# Patient Record
Sex: Female | Born: 1975 | Race: White | Hispanic: No | Marital: Married
Health system: Midwestern US, Community
[De-identification: ages and names within clinical notes are randomized; demographics above are authoritative.]

## PROBLEM LIST (undated history)

## (undated) DIAGNOSIS — F319 Bipolar disorder, unspecified: Secondary | ICD-10-CM

## (undated) DIAGNOSIS — F419 Anxiety disorder, unspecified: Secondary | ICD-10-CM

## (undated) DIAGNOSIS — G43909 Migraine, unspecified, not intractable, without status migrainosus: Secondary | ICD-10-CM

## (undated) DIAGNOSIS — F410 Panic disorder [episodic paroxysmal anxiety] without agoraphobia: Secondary | ICD-10-CM

## (undated) HISTORY — PX: ABDOMINAL HYSTERECTOMY: SHX81

## (undated) HISTORY — PX: CHOLECYSTECTOMY: SHX55

## (undated) HISTORY — PX: APPENDECTOMY: SHX54

---

## 2005-06-27 ENCOUNTER — Emergency Department (HOSPITAL_COMMUNITY): Admission: EM | Admit: 2005-06-27 | Discharge: 2005-06-28 | Payer: Self-pay | Admitting: Emergency Medicine

## 2005-07-24 ENCOUNTER — Emergency Department: Payer: Self-pay | Admitting: Emergency Medicine

## 2007-07-27 ENCOUNTER — Emergency Department: Payer: Self-pay | Admitting: Emergency Medicine

## 2007-12-27 ENCOUNTER — Emergency Department: Payer: Self-pay | Admitting: Emergency Medicine

## 2008-10-11 ENCOUNTER — Emergency Department: Payer: Self-pay | Admitting: Emergency Medicine

## 2009-03-10 ENCOUNTER — Emergency Department: Payer: Self-pay | Admitting: Emergency Medicine

## 2009-06-23 ENCOUNTER — Emergency Department: Payer: Self-pay | Admitting: Emergency Medicine

## 2009-08-09 ENCOUNTER — Emergency Department: Payer: Self-pay | Admitting: Emergency Medicine

## 2010-04-05 ENCOUNTER — Emergency Department: Payer: Self-pay | Admitting: Emergency Medicine

## 2010-05-23 ENCOUNTER — Ambulatory Visit: Payer: Self-pay | Admitting: Internal Medicine

## 2010-12-14 ENCOUNTER — Ambulatory Visit: Payer: Self-pay | Admitting: Family Medicine

## 2011-07-17 ENCOUNTER — Emergency Department: Payer: Self-pay | Admitting: *Deleted

## 2012-06-22 ENCOUNTER — Ambulatory Visit: Payer: Self-pay

## 2015-04-20 ENCOUNTER — Ambulatory Visit
Admission: EM | Admit: 2015-04-20 | Discharge: 2015-04-20 | Disposition: A | Discharge status: Home / Self Care | Payer: Self-pay | Attending: Family Medicine | Admitting: Family Medicine

## 2015-04-20 ENCOUNTER — Encounter: Payer: Self-pay | Admitting: Emergency Medicine

## 2015-04-20 DIAGNOSIS — F411 Generalized anxiety disorder: Secondary | ICD-10-CM

## 2015-04-20 DIAGNOSIS — F430 Acute stress reaction: Secondary | ICD-10-CM

## 2015-04-20 HISTORY — DX: Anxiety disorder, unspecified: F41.9

## 2015-04-20 HISTORY — DX: Migraine, unspecified, not intractable, without status migrainosus: G43.909

## 2015-04-20 MED ORDER — DIAZEPAM 5 MG PO TABS: 5.0000 mg | ORAL_TABLET | Freq: Two times a day (BID) | ORAL | Status: AC

## 2015-04-20 MED ORDER — ESCITALOPRAM OXALATE 10 MG PO TABS: 10.0000 mg | ORAL_TABLET | Freq: Every day | ORAL | Status: AC

## 2015-04-20 NOTE — ED Notes (Signed)
Anxiety regarding work condition for 6 weeks. Recently got worse.

## 2015-04-20 NOTE — ED Provider Notes (Signed)
CSN: [id number]     Arrival date & time 04/20/15  1651 History   First MD Initiated Contact with Patient 04/20/15 1803     Chief Complaint  Patient presents with  . Anxiety   Patient is here because of anxiety attack.  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 She states that the DON  Is getting ready to leave. She reports being at the facility for over 6 years. She is unhappy wants to leave but feels trapped because she cannot Match her salary at her current rate. Patient's tearful at this time. She is able to give me a history that last time she had panic attacks like this was about 4-5 years ago. She is allergic to several SSRIs and benzos  but has been able to tolerate Celexa and diazepam she was also taking years ago Inderal which also seemed to help with things as well.    She reports not want to take medication despite the fact that she has no clear way out of the situation that she feels as initiated her panic attack and anxiety.   should also be noted this is consistent with she's feeling now with her previous panic attack and anxiety attacks.   (Consider location/radiation/quality/duration/timing/severity/associated sxs/prior Treatment) Patient is a 39 y.o. female presenting with anxiety. The history is provided by the patient. No language interpreter was used.  Anxiety This is a new problem. The current episode started 6 to 12 hours ago. The problem occurs constantly. The problem has not changed since onset.Pertinent negatives include no chest pain, no abdominal pain, no headaches and no shortness of breath. The symptoms are aggravated by stress. Nothing relieves the symptoms. She has tried nothing for the symptoms.    Past Medical History  Diagnosis Date  . Anxiety   . Migraine    Past Surgical History  Procedure Laterality Date  . Abdominal hysterectomy    . Cholecystectomy    . Appendectomy      Family History  Problem Relation Age of Onset  . Hyperlipidemia Mother   . Hypertension Mother   . Diabetes Mother   . Diabetes Father   . Hyperlipidemia Father   . Hypertension Father    Social History  Substance Use Topics  . Smoking status: Current Every Day Smoker  . Smokeless tobacco: Current User  . Alcohol Use: No   OB History    No data available     Review of Systems  Respiratory: Negative for shortness of breath.   Cardiovascular: Negative for chest pain.  Gastrointestinal: Negative for abdominal pain.  Neurological: Negative for headaches.   Patient does smoke and was once she needs to stop smoking. She denies any problems with hypertension diabetes. Allergies  Chocolate; Citrus; Clonazepam; Grastek; Imitrex; and Prozac  Home Medications   Prior to Admission medications   Medication Sig Start Date End Date Taking? Authorizing Provider  acetaminophen (TYLENOL) 500 MG tablet Take 500 mg by mouth every 6 (six) hours as needed.   Yes Historical Provider, MD  aspirin-acetaminophen-caffeine (EXCEDRIN MIGRAINE) [phone] MG per tablet Take by mouth every 6 (six) hours as needed for headache.   Yes Historical Provider, MD  diazepam (VALIUM) 5 MG tablet Take 1 tablet (5 mg total) by mouth 2 (two) times daily. 04/20/15   Hassan Rowan, MD  escitalopram (LEXAPRO) 10 MG tablet Take 1 tablet (10 mg total) by mouth daily. 04/20/15   Dennard Nip  Thurmond Butts, MD   BP 133/68 mmHg  Pulse 98  Temp(Src) 97.8 F (36.6 C) (Tympanic)  Resp 16  Ht  (1.626 m)  Wt 222 lb (100.699 kg)  BMI 38.09 kg/m2  SpO2 99% Physical Exam  Constitutional: She is oriented to person, place, and time. She appears well-developed and well-nourished.  Obese white female.  HENT:  Head: Normocephalic and atraumatic.  Eyes: Pupils are equal, round, and reactive to light.  Neck: Normal range of motion. Neck supple.  Cardiovascular: Normal rate, regular rhythm and normal heart sounds.   Pulmonary/Chest:  Effort normal.  Musculoskeletal: Normal range of motion.  Neurological: She is alert and oriented to person, place, and time. No cranial nerve deficit.  Skin: Skin is warm and dry. No erythema.  Psychiatric: Her behavior is normal. Her mood appears anxious. Her speech is not rapid and/or pressured. Thought content is not delusional. Cognition and memory are normal. Cognition and memory are not impaired. She does not exhibit a depressed mood.  Patient actively crying and upset.  Vitals reviewed.   ED Course  Procedures (including critical care time) Labs Review Labs Reviewed - No data to display  Imaging Review No results found.   MDM   1. Generalized anxiety disorder   2. Situational, disturbance, acute      Discussed patient since this may be a long-term situation she needs to get a PCP where she can have her anxiety managed and she is in agreement with that. Will prescribe The SSRI Lexapro since his has less side effects than the Celexa and she's had trouble with certain SSRIs. We'll also prescribe a limited amount of diazepam as well. We'll hold off on Inderal An informed her that she may need thyroid panel testing when she does establish with a PCP. She thinks she can establish with her husband practice at [facility] primary care. Off work note for the next 2 days she declines.      Hassan Rowan, MD 04/20/15 [id number]

## 2015-04-20 NOTE — Discharge Instructions (Signed)
Generalized Anxiety Disorder Generalized anxiety disorder (GAD) is a mental disorder. It interferes with life functions, including relationships, work, and school. GAD is different from normal anxiety, which everyone experiences at some point in their lives in response to specific life events and activities. Normal anxiety actually helps [place] prepare for and get through these life events and activities. Normal anxiety goes away after the event or activity is over.  GAD causes anxiety that is not necessarily related to specific events or activities. It also causes excess anxiety in proportion to specific events or activities. The anxiety associated with GAD is also difficult to control. GAD can vary from mild to severe. People with severe GAD can have intense waves of anxiety with physical symptoms (panic attacks).  SYMPTOMS The anxiety and worry associated with GAD are difficult to control. This anxiety and worry are related to many life events and activities and also occur more days than not for 6 months or longer. People with GAD also have three or more of the following symptoms (one or more in children):  Restlessness.   Fatigue.  Difficulty concentrating.   Irritability.  Muscle tension.  Difficulty sleeping or unsatisfying sleep. DIAGNOSIS GAD is diagnosed through an assessment by your health care provider. Your health care provider will ask you questions aboutyour mood,physical symptoms, and events in your life. Your health care provider may ask you about your medical history and use of alcohol or drugs, including prescription medicines. Your health care provider may also do a physical exam and blood tests. Certain medical conditions and the use of certain substances can cause symptoms similar to those associated with GAD. Your health care provider may refer you to a mental health specialist for further evaluation. TREATMENT The following therapies are usually used to treat GAD:    Medication. Antidepressant medication usually is prescribed for long-term daily control. Antianxiety medicines may be added in severe cases, especially when panic attacks occur.   Talk therapy (psychotherapy). Certain types of talk therapy can be helpful in treating GAD by providing support, education, and guidance. A form of talk therapy called cognitive behavioral therapy can teach you healthy ways to think about and react to daily life events and activities.  Stress managementtechniques. These include yoga, meditation, and exercise and can be very helpful when they are practiced regularly. A mental health specialist can help determine which treatment is best for you. Some people see improvement with one therapy. However, other people require a combination of therapies. Document Released: 12/09/2012 Document Revised: 12/29/2013 Document Reviewed: 12/09/2012 [facility] Patient Information 2015 [place], [place]. This information is not intended to replace advice given to you by your health care provider. Make sure you discuss any questions you have with your health care provider.  Stress and Stress Management Stress is a normal reaction to life events. It is what you feel when life demands more than you are used to or more than you can handle. Some stress can be useful. For example, the stress reaction can help you catch the last bus of the day, study for a test, or meet a deadline at work. But stress that occurs too often or for too long can cause problems. It can affect your emotional health and interfere with relationships and normal daily activities. Too much stress can weaken your immune system and increase your risk for physical illness. If you already have a medical problem, stress can make it worse. CAUSES  All sorts of life events may cause stress. An event that  causes stress for one person may not be stressful for another person. Major life events commonly cause stress. These may be positive  or negative. Examples include losing your job, moving into a new home, getting married, having a baby, or losing a loved one. Less obvious life events may also cause stress, especially if they occur day after day or in combination. Examples include working long hours, driving in traffic, caring for children, being in debt, or being in a difficult relationship. SIGNS AND SYMPTOMS Stress may cause emotional symptoms including, the following:  Anxiety. This is feeling worried, afraid, on edge, overwhelmed, or out of control.  Anger. This is feeling irritated or impatient.  Depression. This is feeling sad, down, helpless, or guilty.  Difficulty focusing, remembering, or making decisions. Stress may cause physical symptoms, including the following:   Aches and pains. These may affect your head, neck, back, stomach, or other areas of your body.  Tight muscles or clenched jaw.  Low energy or trouble sleeping. Stress may cause unhealthy behaviors, including the following:   Eating to feel better (overeating) or skipping meals.  Sleeping too little, too much, or both.  Working too much or putting off tasks (procrastination).  Smoking, drinking alcohol, or using drugs to feel better. DIAGNOSIS  Stress is diagnosed through an assessment by your health care provider. Your health care provider will ask questions about your symptoms and any stressful life events.Your health care provider will also ask about your medical history and may order blood tests or other tests. Certain medical conditions and medicine can cause physical symptoms similar to stress. Mental illness can cause emotional symptoms and unhealthy behaviors similar to stress. Your health care provider may refer you to a mental health professional for further evaluation.  TREATMENT  Stress management is the recommended treatment for stress.The goals of stress management are reducing stressful life events and coping with stress in  healthy ways.  Techniques for reducing stressful life events include the following:  Stress identification. Self-monitor for stress and identify what causes stress for you. These skills may help you to avoid some stressful events.  Time management. Set your priorities, keep a calendar of events, and learn to say "no." These tools can help you avoid making too many commitments. Techniques for coping with stress include the following:  Rethinking the problem. Try to think realistically about stressful events rather than ignoring them or overreacting. Try to find the positives in a stressful situation rather than focusing on the negatives.  Exercise. Physical exercise can release both physical and emotional tension. The key is to find a form of exercise you enjoy and do it regularly.  Relaxation techniques. These relax the body and mind. Examples include yoga, meditation, tai chi, biofeedback, deep breathing, progressive muscle relaxation, listening to music, being out in nature, journaling, and other hobbies. Again, the key is to find one or more that you enjoy and can do regularly.  Healthy lifestyle. Eat a balanced diet, get plenty of sleep, and do not smoke. Avoid using alcohol or drugs to relax.  Strong support network. Spend time with family, friends, or other people you enjoy being around.Express your feelings and talk things over with someone you trust. Counseling or talktherapy with a mental health professional may be helpful if you are having difficulty managing stress on your own. Medicine is typically not recommended for the treatment of stress.Talk to your health care provider if you think you need medicine for symptoms of stress. HOME CARE INSTRUCTIONS  Keep all follow-up visits as directed by your health care provider.  Take all medicines as directed by your health care provider. SEEK MEDICAL CARE IF:  Your symptoms get worse or you start having new symptoms.  You feel  overwhelmed by your problems and can no longer manage them on your own. SEEK IMMEDIATE MEDICAL CARE IF:  You feel like hurting yourself or someone else. Document Released: 02/07/2001 Document Revised: 12/29/2013 Document Reviewed: 04/08/2013 [facility] Patient Information 2015 [place], [place]. This information is not intended to replace advice given to you by your health care provider. Make sure you discuss any questions you have with your health care provider.

## 2015-06-23 ENCOUNTER — Ambulatory Visit: Payer: Self-pay

## 2015-06-23 ENCOUNTER — Ambulatory Visit
Admission: EM | Admit: 2015-06-23 | Discharge: 2015-06-23 | Disposition: A | Discharge status: Home / Self Care | Payer: Self-pay | Attending: Family Medicine | Admitting: Family Medicine

## 2015-06-23 DIAGNOSIS — J209 Acute bronchitis, unspecified: Secondary | ICD-10-CM

## 2015-06-23 DIAGNOSIS — J40 Bronchitis, not specified as acute or chronic: Secondary | ICD-10-CM

## 2015-06-23 MED ORDER — HYDROCOD POLST-CPM POLST ER 10-8 MG/5ML PO SUER: 5.0000 mL | Freq: Every evening | ORAL | Status: AC | PRN

## 2015-06-23 MED ORDER — AZITHROMYCIN 250 MG PO TABS: ORAL_TABLET | ORAL | Status: AC

## 2015-06-23 MED ORDER — IPRATROPIUM-ALBUTEROL 0.5-2.5 (3) MG/3ML IN SOLN
3.0000 mL | Freq: Once | RESPIRATORY_TRACT | Status: AC
  Administered 2015-06-23: 3 mL via RESPIRATORY_TRACT

## 2015-06-23 MED ORDER — ALBUTEROL SULFATE HFA 108 (90 BASE) MCG/ACT IN AERS: 1.0000 | INHALATION_SPRAY | Freq: Four times a day (QID) | RESPIRATORY_TRACT | Status: AC | PRN

## 2015-06-23 MED ORDER — PREDNISONE 20 MG PO TABS: ORAL_TABLET | ORAL | Status: AC

## 2015-06-23 NOTE — ED Notes (Signed)
Started Sunday "not feeling good". Monday "feeling worse". + productive green productive cough and laryngitis. 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 + fever of 101 Monday.

## 2015-06-23 NOTE — ED Provider Notes (Signed)
CSN: [id number]     Arrival date & time 06/23/15  1544 History   First MD Initiated Contact with Patient 06/23/15 1648     Chief Complaint  Patient presents with  . URI   (Consider location/radiation/quality/duration/timing/severity/associated sxs/prior Treatment) HPI Comments: 39 yo female with a 4 days h/o progressively worsening productive cough and wheezing, fevers, chills, and intermittent shortness of breath. Denies any chest pains. Patient is a smoker (> 10 years).  The history is provided by the patient.    Past Medical History  Diagnosis Date  . Anxiety   . Migraine    Past Surgical History  Procedure Laterality Date  . Abdominal hysterectomy    . Cholecystectomy    . Appendectomy     Family History  Problem Relation Age of Onset  . Hyperlipidemia Mother   . Hypertension Mother   . Diabetes Mother   . Diabetes Father   . Hyperlipidemia Father   . Hypertension Father    Social History  Substance Use Topics  . Smoking status: Current Every Day Smoker  . Smokeless tobacco: Current User  . Alcohol Use: Yes     Comment: rarely   OB History    No data available     Review of Systems  Allergies  Chocolate; Citrus; Clonazepam; Grastek; Imitrex; and Prozac  Home Medications   Prior to Admission medications   Medication Sig Start Date End Date Taking? Authorizing Provider  dextromethorphan-guaiFENesin (MUCINEX DM) 30-600 MG 12hr tablet Take 1 tablet by mouth 2 (two) times daily.   Yes Historical Provider, MD  diazepam (VALIUM) 5 MG tablet Take 1 tablet (5 mg total) by mouth 2 (two) times daily. 04/20/15  Yes Hassan RowanEugene Wade, MD  escitalopram (LEXAPRO) 10 MG tablet Take 1 tablet (10 mg total) by mouth daily. 04/20/15  Yes Hassan RowanEugene Wade, MD  acetaminophen (TYLENOL) 500 MG tablet Take 500 mg by mouth every 6 (six) hours as needed.    Historical Provider, MD  albuterol (PROVENTIL HFA;VENTOLIN HFA) 108 (90 BASE) MCG/ACT inhaler Inhale 1-2 puffs into the lungs every 6 (six)  hours as needed for wheezing or shortness of breath. 06/23/15   Payton Mccallumrlando Bakary Bramer, MD  aspirin-acetaminophen-caffeine (EXCEDRIN MIGRAINE) [phone] MG per tablet Take by mouth every 6 (six) hours as needed for headache.    Historical Provider, MD  azithromycin (ZITHROMAX Z-PAK) 250 MG tablet 2 tabs po once day 1, then 1 tab po qd for next 4 days 06/23/15   Payton Mccallumrlando Nevaan Bunton, MD  chlorpheniramine-HYDROcodone [facility](TUSSIONEX PENNKINETIC ER) 10-8 MG/5ML SUER Take 5 mLs by mouth at bedtime as needed for cough. 06/23/15   Payton Mccallumrlando Nairi Oswald, MD  predniSONE (DELTASONE) 20 MG tablet 3 tabs po qd for 3 days, then 2 tabs po qd for 4 days, then 1 tab po qd for 4 days, then half a tab po qd for 3 days 06/23/15   Payton Mccallumrlando Salaya Holtrop, MD   Meds Ordered and Administered this Visit   Medications  ipratropium-albuterol (DUONEB) 0.5-2.5 (3) MG/3ML nebulizer solution 3 mL (3 mLs Nebulization Given 06/23/15 1625)  ipratropium-albuterol (DUONEB) 0.5-2.5 (3) MG/3ML nebulizer solution 3 mL (3 mLs Nebulization Given 06/23/15 1648)  ipratropium-albuterol (DUONEB) 0.5-2.5 (3) MG/3ML nebulizer solution 3 mL (3 mLs Nebulization Given 06/23/15 1744)    BP 135/69 mmHg  Pulse 86  Temp(Src) 98.1 F (36.7 C) (Tympanic)  Resp 16  Ht 5\' 4"  (1.626 m)  Wt 220 lb (99.791 kg)  BMI 37.74 kg/m2  SpO2 92% No data found.   Physical Exam  Constitutional: She appears well-developed and well-nourished. No distress.  HENT:  Head: Normocephalic.  Right Ear: Tympanic membrane, external ear and ear canal normal.  Left Ear: Tympanic membrane, external ear and ear canal normal.  Nose: Nose normal.  Mouth/Throat: Oropharynx is clear and moist and mucous membranes are normal.  Eyes: Conjunctivae and EOM are normal. Pupils are equal, round, and reactive to light. Right eye exhibits no discharge. Left eye exhibits no discharge. No scleral icterus.  Neck: Normal range of motion. Neck supple. No JVD present. No tracheal deviation present. No thyromegaly  present.  Cardiovascular: Normal rate, regular rhythm, normal heart sounds and intact distal pulses.   No murmur heard. Pulmonary/Chest: Effort normal. No stridor. No respiratory distress. She has wheezes (diffuse inspiratory and expiratory wheezes and rhonchi bilaterally). She has no rales. She exhibits no tenderness.  Musculoskeletal: She exhibits no edema.  Lymphadenopathy:    She has no cervical adenopathy.  Neurological: She is alert.  Skin: Skin is dry. No rash noted. She is not diaphoretic.  Nursing note and vitals reviewed.   ED Course  Procedures (including critical care time)  Labs Review Labs Reviewed - No data to display  Imaging Review Dg Chest 2 View  06/23/2015  CLINICAL DATA:  39 year old female with productive cough, fever, shortness of breath and chest tightness for 3 days. EXAM: CHEST  2 VIEW COMPARISON:  06/23/2009 chest radiograph FINDINGS: The cardiomediastinal silhouette is unremarkable. There is no evidence of focal airspace disease, pulmonary edema, suspicious pulmonary nodule/mass, pleural effusion, or pneumothorax. No acute bony abnormalities are identified. Cholecystectomy clips are identified. IMPRESSION: No active cardiopulmonary disease. Electronically Signed   By: Harmon Pier M.D.   On: 06/23/2015 16:46     Visual Acuity Review  Right Eye Distance:   Left Eye Distance:   Bilateral Distance:    Right Eye Near:   Left Eye Near:    Bilateral Near:         MDM   1. Bronchitis with bronchospasm    Discharge Medication List as of 06/23/2015  6:22 PM    START taking these medications   Details  albuterol (PROVENTIL HFA;VENTOLIN HFA) 108 (90 BASE) MCG/ACT inhaler Inhale 1-2 puffs into the lungs every 6 (six) hours as needed for wheezing or shortness of breath., Starting 06/23/2015, Until Discontinued, Normal    azithromycin (ZITHROMAX Z-PAK) 250 MG tablet 2 tabs po once day 1, then 1 tab po qd for next 4 days, Normal     chlorpheniramine-HYDROcodone (TUSSIONEX PENNKINETIC ER) 10-8 MG/5ML SUER Take 5 mLs by mouth at bedtime as needed for cough., Starting 06/23/2015, Until Discontinued, Normal    predniSONE (DELTASONE) 20 MG tablet 3 tabs po qd for 3 days, then 2 tabs po qd for 4 days, then 1 tab po qd for 4 days, then half a tab po qd for 3 days, Normal      1. x-Charles results and diagnosis reviewed with patient 2. rx as per orders above; reviewed possible side effects, interactions, risks and benefits  3. Patient given Duoneb treatment x 3 with improvement of symptoms; O2 sat: 94% 4. Recommend supportive treatment with increased fluids 5. Recommend smoking cessation  6. To ED if symptoms worsen tonight 7. Follow up prn     Payton Mccallum, MD 06/23/15 2047

## 2017-02-15 ENCOUNTER — Emergency Department: Payer: Self-pay

## 2017-02-15 ENCOUNTER — Encounter: Payer: Self-pay | Admitting: Emergency Medicine

## 2017-02-15 ENCOUNTER — Emergency Department
Admission: EM | Admit: 2017-02-15 | Discharge: 2017-02-15 | Disposition: A | Discharge status: Home / Self Care | Payer: Self-pay | Attending: Student in an Organized Health Care Education/Training Program | Admitting: Student in an Organized Health Care Education/Training Program

## 2017-02-15 DIAGNOSIS — Z79899 Other long term (current) drug therapy: Secondary | ICD-10-CM | POA: Insufficient documentation

## 2017-02-15 DIAGNOSIS — F17200 Nicotine dependence, unspecified, uncomplicated: Secondary | ICD-10-CM | POA: Insufficient documentation

## 2017-02-15 DIAGNOSIS — F410 Panic disorder [episodic paroxysmal anxiety] without agoraphobia: Secondary | ICD-10-CM | POA: Insufficient documentation

## 2017-02-15 HISTORY — DX: Bipolar disorder, unspecified: F31.9

## 2017-02-15 HISTORY — DX: Panic disorder (episodic paroxysmal anxiety): F41.0

## 2017-02-15 LAB — BASIC METABOLIC PANEL
ANION GAP: 12 (ref 5–15)
BUN: 13 mg/dL (ref 6–20)
CALCIUM: 8.9 mg/dL (ref 8.9–10.3)
CO2: 20 mmol/L — ABNORMAL LOW (ref 22–32)
CREATININE: 0.98 mg/dL (ref 0.44–1.00)
Chloride: 108 mmol/L (ref 101–111)
Glucose, Bld: 113 mg/dL — ABNORMAL HIGH (ref 65–99)
Potassium: 3.2 mmol/L — ABNORMAL LOW (ref 3.5–5.1)
Sodium: 140 mmol/L (ref 135–145)

## 2017-02-15 LAB — CBC
HCT: 44 % (ref 35.0–47.0)
HEMOGLOBIN: 15.2 g/dL (ref 12.0–16.0)
MCH: 32.2 pg (ref 26.0–34.0)
MCHC: 34.5 g/dL (ref 32.0–36.0)
MCV: 93.4 fL (ref 80.0–100.0)
PLATELETS: 254 10*3/uL (ref 150–440)
RBC: 4.72 MIL/uL (ref 3.80–5.20)
RDW: 13.1 % (ref 11.5–14.5)
WBC: 11.7 10*3/uL — ABNORMAL HIGH (ref 3.6–11.0)

## 2017-02-15 LAB — HCG, QUANTITATIVE, PREGNANCY: HCG, BETA CHAIN, QUANT, S: 3 m[IU]/mL (ref <5)

## 2017-02-15 LAB — TROPONIN I

## 2017-02-15 MED ORDER — HALOPERIDOL LACTATE 5 MG/ML IJ SOLN
10.0000 mg | Freq: Once | INTRAMUSCULAR | Status: AC
  Administered 2017-02-15: 10 mg via INTRAVENOUS
  Filled 2017-02-15: qty 2

## 2017-02-15 NOTE — ED Notes (Signed)
Pt resting comfortably in bed with husband at bedside at this time

## 2017-02-15 NOTE — ED Provider Notes (Signed)
[facility] Emergency Department Provider Note    First MD Initiated Contact with Patient 02/15/17 2101     (approximate)  I have reviewed the triage vital signs and the nursing notes.   HISTORY  Chief Complaint Panic Attack    HPI Melissa Boyle is a 41 y.o. female with a history of anxiety bipolar disorder and panic attacks presents with severe anxiety and chest pain. Patient arrives and what appears to be moderate distress. Not speaking complete sentences hyperventilating. Clutching her chest. Patient will state panic attack in between rapid breaths. Patient is coming from home. Denies taking any medications prior to arrival. States the pain as 1010 in severity.   Past Medical History:  Diagnosis Date  . Anxiety   . Bipolar 1 disorder (HCC)   . Migraine   . Panic attacks    Family History  Problem Relation Age of Onset  . Hyperlipidemia Mother   . Hypertension Mother   . Diabetes Mother   . Diabetes Father   . Hyperlipidemia Father   . Hypertension Father    Past Surgical History:  Procedure Laterality Date  . ABDOMINAL HYSTERECTOMY    . APPENDECTOMY    . CHOLECYSTECTOMY     There are no active problems to display for this patient.     Prior to Admission medications   Medication Sig Start Date End Date Taking? Authorizing Provider  acetaminophen (TYLENOL) 500 MG tablet Take 500 mg by mouth every 6 (six) hours as needed.    [provider]  albuterol (PROVENTIL HFA;VENTOLIN HFA) 108 (90 BASE) MCG/ACT inhaler Inhale 1-2 puffs into the lungs every 6 (six) hours as needed for wheezing or shortness of breath. 06/23/15   Payton Mccallum, MD  aspirin-acetaminophen-caffeine (EXCEDRIN MIGRAINE) [phone] MG per tablet Take by mouth every 6 (six) hours as needed for headache.    [provider]  azithromycin (ZITHROMAX Z-PAK) 250 MG tablet 2 tabs po once day 1, then 1 tab po qd for next 4 days 06/23/15   Payton Mccallum, MD    chlorpheniramine-HYDROcodone [facility] PENNKINETIC ER) 10-8 MG/5ML SUER Take 5 mLs by mouth at bedtime as needed for cough. 06/23/15   Payton Mccallum, MD  dextromethorphan-guaiFENesin [facility] DM) 30-600 MG 12hr tablet Take 1 tablet by mouth 2 (two) times daily.    [provider]  diazepam (VALIUM) 5 MG tablet Take 1 tablet (5 mg total) by mouth 2 (two) times daily. 04/20/15   Hassan Rowan, MD  escitalopram (LEXAPRO) 10 MG tablet Take 1 tablet (10 mg total) by mouth daily. 04/20/15   Hassan Rowan, MD  predniSONE (DELTASONE) 20 MG tablet 3 tabs po qd for 3 days, then 2 tabs po qd for 4 days, then 1 tab po qd for 4 days, then half a tab po qd for 3 days 06/23/15   Payton Mccallum, MD    Allergies Chocolate; Citrus; Clonazepam; Grastek [timothy grass pollen allergen]; Imitrex [sumatriptan]; and Prozac [fluoxetine hcl]    Social History Social History  Substance Use Topics  . Smoking status: Current Every Day Smoker  . Smokeless tobacco: Current User  . Alcohol use Yes     Comment: rarely    Review of Systems Patient denies headaches, rhinorrhea, blurry vision, numbness, shortness of breath, chest pain, edema, cough, abdominal pain, nausea, vomiting, diarrhea, dysuria, fevers, rashes or hallucinations unless otherwise stated above in HPI. ____________________________________________   PHYSICAL EXAM:  VITAL SIGNS: Vitals:   02/15/17 2200 02/15/17 2230  BP: 103/67 Marland Kitchen)  95/55  Pulse: 85 82  Resp: 13   Temp:      Constitutional: Alert and oriented. Severely anxious tachypneic and hyperventilating Eyes: Conjunctivae are normal.  Head: Atraumatic. Nose: No congestion/rhinnorhea. Mouth/Throat: Mucous membranes are moist.   Neck: No stridor. Painless ROM.  Cardiovascular: tachycardic rate, regular rhythm. Grossly normal heart sounds.  Good peripheral circulation. Respiratory: tachypnic, hyperventilating.  BS clear throughout Gastrointestinal: Soft and nontender. No  distention. No abdominal bruits. No CVA tenderness. Musculoskeletal: No lower extremity tenderness nor edema.  No joint effusions. Neurologic:  No gross focal neurologic deficits are appreciated. No facial droop Skin:  Skin is warm, dry and intact. No rash noted. Psychiatric: anxious, panic attack  ____________________________________________   LABS (all labs ordered are listed, but only abnormal results are displayed)  Results for orders placed or performed during the hospital encounter of 02/15/17 (from the past 24 hour(s))  hCG, quantitative, pregnancy     Status: None   Collection Time: 02/15/17  9:47 PM  Result Value Ref Range   hCG, Beta Chain, Quant, S 3 <5 mIU/mL  Basic metabolic panel     Status: Abnormal   Collection Time: 02/15/17  9:47 PM  Result Value Ref Range   Sodium 140 135 - 145 mmol/L   Potassium 3.2 (L) 3.5 - 5.1 mmol/L   Chloride 108 101 - 111 mmol/L   CO2 20 (L) 22 - 32 mmol/L   Glucose, Bld 113 (H) 65 - 99 mg/dL   BUN 13 6 - 20 mg/dL   Creatinine, Ser 1.610.98 0.44 - 1.00 mg/dL   Calcium 8.9 8.9 - 09.610.3 mg/dL   GFR calc non Af Amer >60 >60 mL/min   GFR calc Af Amer >60 >60 mL/min   Anion gap 12 5 - 15  CBC     Status: Abnormal   Collection Time: 02/15/17  9:47 PM  Result Value Ref Range   WBC 11.7 (H) 3.6 - 11.0 K/uL   RBC 4.72 3.80 - 5.20 MIL/uL   Hemoglobin 15.2 12.0 - 16.0 g/dL   HCT 04.544.0 40.935.0 - 81.147.0 %   MCV 93.4 80.0 - 100.0 fL   MCH 32.2 26.0 - 34.0 pg   MCHC 34.5 32.0 - 36.0 g/dL   RDW 91.413.1 78.211.5 - 95.614.5 %   Platelets 254 150 - 440 K/uL  Troponin I     Status: None   Collection Time: 02/15/17  9:47 PM  Result Value Ref Range   Troponin I <0.03 <0.03 ng/mL   ____________________________________________  EKG My review and personal interpretation at Time: 21:34   Indication: chest pain  Rate: 85  Rhythm: sinus Axis: normal Other: normal intervals, no STEMI ____________________________________________  RADIOLOGY  I personally reviewed all  radiographic images ordered to evaluate for the above acute complaints and reviewed radiology reports and findings.  These findings were personally discussed with the patient.  Please see medical record for radiology report.  ____________________________________________   PROCEDURES  Procedure(s) performed:  Procedures    Critical Care performed: no ____________________________________________   INITIAL IMPRESSION / ASSESSMENT AND PLAN / ED COURSE  Pertinent labs & imaging results that were available during my care of the patient were reviewed by me and considered in my medical decision making (see chart for details).  DDX: panic attack, acs, copd, asthma , pna  Saharra N Rosalia HammersRay is a 41 y.o. who presents to the ED with evidence of probable severe panic attack. Based on her age and severity of anxiety will further evaluate  for any evidence of ACS or pneumothorax or pulmonary process..  As the patient states that the symptoms came on after an argument and do feel this most likely like secondary to severe anxiety.  The patient will be placed on continuous pulse oximetry and telemetry for monitoring.  Laboratory evaluation will be sent to evaluate for the above complaints.     Clinical Course as of Feb 16 100  Thu Feb 15, 2017  2257 Patient now feels completely better. States that she's had similar episodes in the past. States that she did get an argument with her family which precipitated this episode. Denies any chest pain or shortness of breath this time. She is requesting discharge home.  Patient was able to tolerate PO and was able to ambulate with a steady gait.  Have discussed with the patient and available family all diagnostics and treatments performed thus far and all questions were answered to the best of my ability. The patient demonstrates understanding and agreement with plan.   [PR]    Clinical Course User Index [PR] Willy Eddy, MD      ____________________________________________   FINAL CLINICAL IMPRESSION(S) / ED DIAGNOSES  Final diagnoses:  Panic attack      NEW MEDICATIONS STARTED DURING THIS VISIT:  Discharge Medication List as of 02/15/2017 11:05 PM       Note:  This document was prepared using Dragon voice recognition software and may include unintentional dictation errors.    Willy Eddy, MD 02/16/17 [phone]

## 2017-02-15 NOTE — ED Notes (Addendum)
Pt bib [facility] d/t panic attack. Pt states hx same. Pt reported husband had argument with someone and recently pt grandmother told her she didn't love her anymore so pt began having central chest pain, short/shallow respirations, diaphoresis and called sister who called 911.

## 2017-02-15 NOTE — ED Notes (Signed)

## 2017-02-16 DIAGNOSIS — M62838 Other muscle spasm: Secondary | ICD-10-CM | POA: Insufficient documentation

## 2017-02-16 DIAGNOSIS — F17200 Nicotine dependence, unspecified, uncomplicated: Secondary | ICD-10-CM | POA: Insufficient documentation

## 2017-02-16 DIAGNOSIS — Z7982 Long term (current) use of aspirin: Secondary | ICD-10-CM | POA: Insufficient documentation

## 2017-02-16 DIAGNOSIS — Z79899 Other long term (current) drug therapy: Secondary | ICD-10-CM | POA: Insufficient documentation

## 2017-02-17 ENCOUNTER — Emergency Department
Admission: EM | Admit: 2017-02-17 | Discharge: 2017-02-17 | Disposition: A | Discharge status: Home / Self Care | Payer: Self-pay | Attending: Emergency Medicine | Admitting: Emergency Medicine

## 2017-02-17 ENCOUNTER — Encounter: Payer: Self-pay | Admitting: Emergency Medicine

## 2017-02-17 ENCOUNTER — Emergency Department: Payer: Self-pay

## 2017-02-17 DIAGNOSIS — M62838 Other muscle spasm: Secondary | ICD-10-CM

## 2017-02-17 MED ORDER — FENTANYL CITRATE (PF) 100 MCG/2ML IJ SOLN
100.0000 ug | Freq: Once | INTRAMUSCULAR | Status: AC
  Administered 2017-02-17: 100 ug via INTRAVENOUS
  Filled 2017-02-17: qty 2

## 2017-02-17 NOTE — ED Triage Notes (Addendum)
Patient presents via POV with co jaw pain spasms that is making the patient clench down hard.  Patient given a wad of 2x2 to bite down on to relieve the pressure.  She states this started tonight at 8pm.  Pt denies any use of drugs.  She states she had a tetanus shot a year ago.  Initial BP is 181/101, 131P.  Patient states she feels like her throat is tight due to the clenching.

## 2017-02-17 NOTE — ED Provider Notes (Signed)
[facility] Emergency Department Provider Note  ____________________________________________   First MD Initiated Contact with Patient 02/17/17 0030     (approximate)  I have reviewed the triage vital signs and the nursing notes.   HISTORY  Chief Complaint Jaw Pain   HPI Melissa Boyle is a 41 y.o. female comes to the emergency department with severe bilateral jaw pain that occurred roughly an hour prior to arrival. She said the pain feels like twisting and cramping and she feels like she can't quite catch her breath. She was not yawning was not laughing and had no trauma. She says that her jaw was thrust forward and she normally does not have an underbite. She has not tried to drink water or eat food.   Past Medical History:  Diagnosis Date  . Anxiety   . Bipolar 1 disorder (HCC)   . Migraine   . Panic attacks     There are no active problems to display for this patient.   Past Surgical History:  Procedure Laterality Date  . ABDOMINAL HYSTERECTOMY    . APPENDECTOMY    . CHOLECYSTECTOMY      Prior to Admission medications   Medication Sig Start Date End Date Taking? Authorizing Provider  acetaminophen (TYLENOL) 500 MG tablet Take 500 mg by mouth every 6 (six) hours as needed.    [provider]  albuterol (PROVENTIL HFA;VENTOLIN HFA) 108 (90 BASE) MCG/ACT inhaler Inhale 1-2 puffs into the lungs every 6 (six) hours as needed for wheezing or shortness of breath. 06/23/15   Payton Mccallumonty, [place], MD  aspirin-acetaminophen-caffeine (EXCEDRIN MIGRAINE) [phone] MG per tablet Take by mouth every 6 (six) hours as needed for headache.    [provider]  azithromycin (ZITHROMAX Z-PAK) 250 MG tablet 2 tabs po once day 1, then 1 tab po qd for next 4 days 06/23/15   Payton Mccallumonty, [place], MD  chlorpheniramine-HYDROcodone [facility](TUSSIONEX PENNKINETIC ER) 10-8 MG/5ML SUER Take 5 mLs by mouth at bedtime as needed for cough. 06/23/15   Payton Mccallumonty, [place], MD    dextromethorphan-guaiFENesin [facility](MUCINEX DM) 30-600 MG 12hr tablet Take 1 tablet by mouth 2 (two) times daily.    [provider]  diazepam (VALIUM) 5 MG tablet Take 1 tablet (5 mg total) by mouth 2 (two) times daily. 04/20/15   Hassan RowanWade, Eugene, MD  escitalopram (LEXAPRO) 10 MG tablet Take 1 tablet (10 mg total) by mouth daily. 04/20/15   Hassan RowanWade, Eugene, MD  predniSONE (DELTASONE) 20 MG tablet 3 tabs po qd for 3 days, then 2 tabs po qd for 4 days, then 1 tab po qd for 4 days, then half a tab po qd for 3 days 06/23/15   Payton Mccallumonty, [place], MD    Allergies Chocolate; Citrus; Clonazepam; Grastek [timothy grass pollen allergen]; Imitrex [sumatriptan]; and Prozac [fluoxetine hcl]  Family History  Problem Relation Age of Onset  . Hyperlipidemia Mother   . Hypertension Mother   . Diabetes Mother   . Diabetes Father   . Hyperlipidemia Father   . Hypertension Father     Social History Social History  Substance Use Topics  . Smoking status: Current Every Day Smoker  . Smokeless tobacco: Current User  . Alcohol use Yes     Comment: rarely    Review of Systems Constitutional: No fever/chills ENT: No sore throat. Cardiovascular: Denies chest pain. Respiratory: Positive shortness of breath. Gastrointestinal: No abdominal pain.  No nausea, no vomiting.  No diarrhea.  No constipation. Musculoskeletal: Negative for back pain. Neurological:  Negative for headaches   ____________________________________________   PHYSICAL EXAM:  VITAL SIGNS: ED Triage Vitals [02/17/17 0012]  Enc Vitals Group     BP (!) 182/101     Pulse Rate (!) 131     Resp 18     Temp 98.6 F (37 C)     Temp Source Oral     SpO2 97 %     Weight      Height      Head Circumference      Peak Flow      Pain Score 10     Pain Loc      Pain Edu?      Excl. in GC?     Constitutional: Alert and oriented 4 tearful and anxious appearing Head: Atraumatic. Nose: No congestion/rhinnorhea. Mouth/Throat: No trismus  jaw thrust forward and unable to fully close her mouth Neck: No stridor.   Cardiovascular: Tachycardic regular rhythm Respiratory: Increased respiratory effort.  No retractions. Gastrointestinal: Soft nontender Neurologic:  Normal speech and language. No gross focal neurologic deficits are appreciated.  Skin:  Skin is warm, dry and intact. No rash noted.    ____________________________________________  LABS (all labs ordered are listed, but only abnormal results are displayed)  Labs Reviewed - No data to display   __________________________________________  EKG   ____________________________________________  RADIOLOGY  X-rays normal ____________________________________________   PROCEDURES  Procedure(s) performed: no  Procedures  Critical Care performed: no  Observation: no ____________________________________________   INITIAL IMPRESSION / ASSESSMENT AND PLAN / ED COURSE  Pertinent labs & imaging results that were available during my care of the patient were reviewed by me and considered in my medical decision making (see chart for details).  The patient arrives tachycardic tearful and uncomfortable appearing. She has no trismus and is able to open her mouth although is not able to fully close it. History is not completely consistent with a jaw dislocation, however I'm somewhat concerned. We will give her 100 g of IV fentanyl along with an x-Guillermo and reevaluate.  Fortunately the patient's x-Guttierrez is normal. Although this is not completely sensitive after pain control she is now able to open and close her mouth without difficulty. This likely represented anxiety and muscle spasm. Strict return precautions given. She is discharged home in improved condition.      ____________________________________________   FINAL CLINICAL IMPRESSION(S) / ED DIAGNOSES  Final diagnoses:  Muscle spasm      NEW MEDICATIONS STARTED DURING THIS VISIT:  Discharge Medication  List as of 02/17/2017  1:43 AM       Note:  This document was prepared using Dragon voice recognition software and may include unintentional dictation errors.      Merrily Brittle, MD 02/17/17 [phone]

## 2017-02-17 NOTE — ED Notes (Signed)
Patient took 1000 mg tylenol at 2.5 mg valium at approx 2330 on 6/22 with no relief.

## 2017-02-17 NOTE — ED Notes (Signed)
ED Provider at bedside. 

## 2017-02-17 NOTE — ED Notes (Signed)
This RN made unsuccessful attempt at IV insertion. RN Butch to bedside to attempt to gain IV access.

## 2017-02-17 NOTE — ED Notes (Signed)
Patient c/o jaw spasm beginning at 2000 on 6/22. Pt reports panic attack that she was seen in this ED for yesterday.  Pt c/o current anxiety, however denies current panic attack.  Patient reports that she feels like her jaw is clenching so forcefully that she may break her teeth.

## 2017-02-17 NOTE — Discharge Instructions (Signed)
Please follow-up with her primary care physician as needed and return to the emergency department for any concerns.  It was a pleasure to take care of you today, and thank you for coming to our emergency department.  If you have any questions or concerns before leaving please ask the nurse to grab me and I'm more than happy to go through your aftercare instructions again.  If you were prescribed any opioid pain medication today such as Norco, Vicodin, Percocet, morphine, hydrocodone, or oxycodone please make sure you do not drive when you are taking this medication as it can alter your ability to drive safely.  If you have any concerns once you are home that you are not improving or are in fact getting worse before you can make it to your follow-up appointment, please do not hesitate to call 911 and come back for further evaluation.  Merrily BrittleNeil Brandey Vandalen MD  Results for orders placed or performed during the hospital encounter of 02/15/17  hCG, quantitative, pregnancy  Result Value Ref Range   hCG, Beta Chain, Quant, S 3 <5 mIU/mL  Basic metabolic panel  Result Value Ref Range   Sodium 140 135 - 145 mmol/L   Potassium 3.2 (L) 3.5 - 5.1 mmol/L   Chloride 108 101 - 111 mmol/L   CO2 20 (L) 22 - 32 mmol/L   Glucose, Bld 113 (H) 65 - 99 mg/dL   BUN 13 6 - 20 mg/dL   Creatinine, Ser 0.980.98 0.44 - 1.00 mg/dL   Calcium 8.9 8.9 - 11.910.3 mg/dL   GFR calc non Af Amer >60 >60 mL/min   GFR calc Af Amer >60 >60 mL/min   Anion gap 12 5 - 15  CBC  Result Value Ref Range   WBC 11.7 (H) 3.6 - 11.0 K/uL   RBC 4.72 3.80 - 5.20 MIL/uL   Hemoglobin 15.2 12.0 - 16.0 g/dL   HCT 14.744.0 82.935.0 - 56.247.0 %   MCV 93.4 80.0 - 100.0 fL   MCH 32.2 26.0 - 34.0 pg   MCHC 34.5 32.0 - 36.0 g/dL   RDW 13.013.1 86.511.5 - 78.414.5 %   Platelets 254 150 - 440 K/uL  Troponin I  Result Value Ref Range   Troponin I <0.03 <0.03 ng/mL   Dg Mandible 4 Views  Result Date: 02/17/2017 CLINICAL DATA:  Jaw spasm, assess dislocation. EXAM: MANDIBLE -  4+ VIEW COMPARISON:  None. FINDINGS: There is no evidence of fracture or other focal bone lesions. IMPRESSION: Negative.  Please note, CT is reference standard. Electronically Signed   By: Awilda Metroourtnay  Bloomer M.D.   On: 02/17/2017 01:24   Dg Chest Portable 1 View  Result Date: 02/15/2017 CLINICAL DATA:  Chest pain EXAM: PORTABLE CHEST 1 VIEW COMPARISON:  Chest radiograph 06/23/2015 FINDINGS: The heart size and mediastinal contours are within normal limits. Both lungs are clear. No pneumothorax. The visualized skeletal structures are unremarkable. IMPRESSION: No pneumothorax.  Clear lungs. Electronically Signed   By: Deatra RobinsonKevin  Herman M.D.   On: 02/15/2017 21:49

## 2018-09-03 IMAGING — CR DG MANDIBLE 4+V
4 series · 4 of 4 positions shown · non-contrast
Comparison: None.

CLINICAL DATA: Jaw spasm, assess dislocation.

EXAM:
MANDIBLE - 4+ VIEW

[mandible [person_name]]
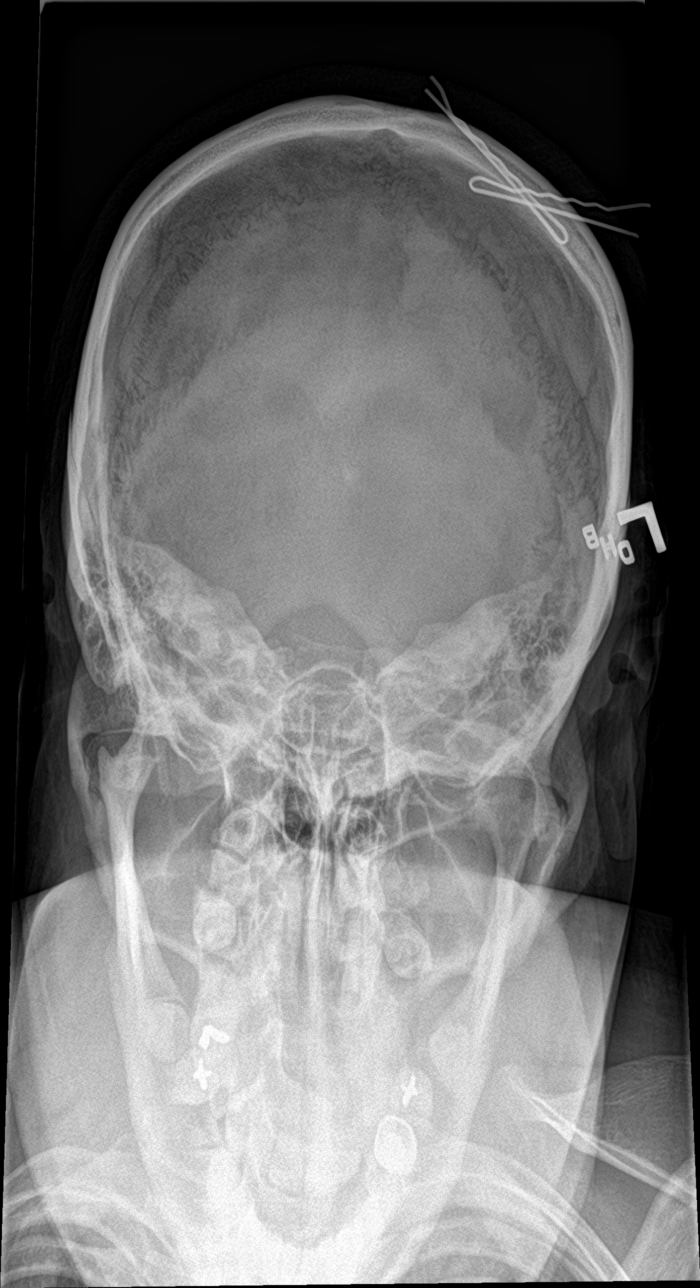

[mandible obl (1 of 2)]
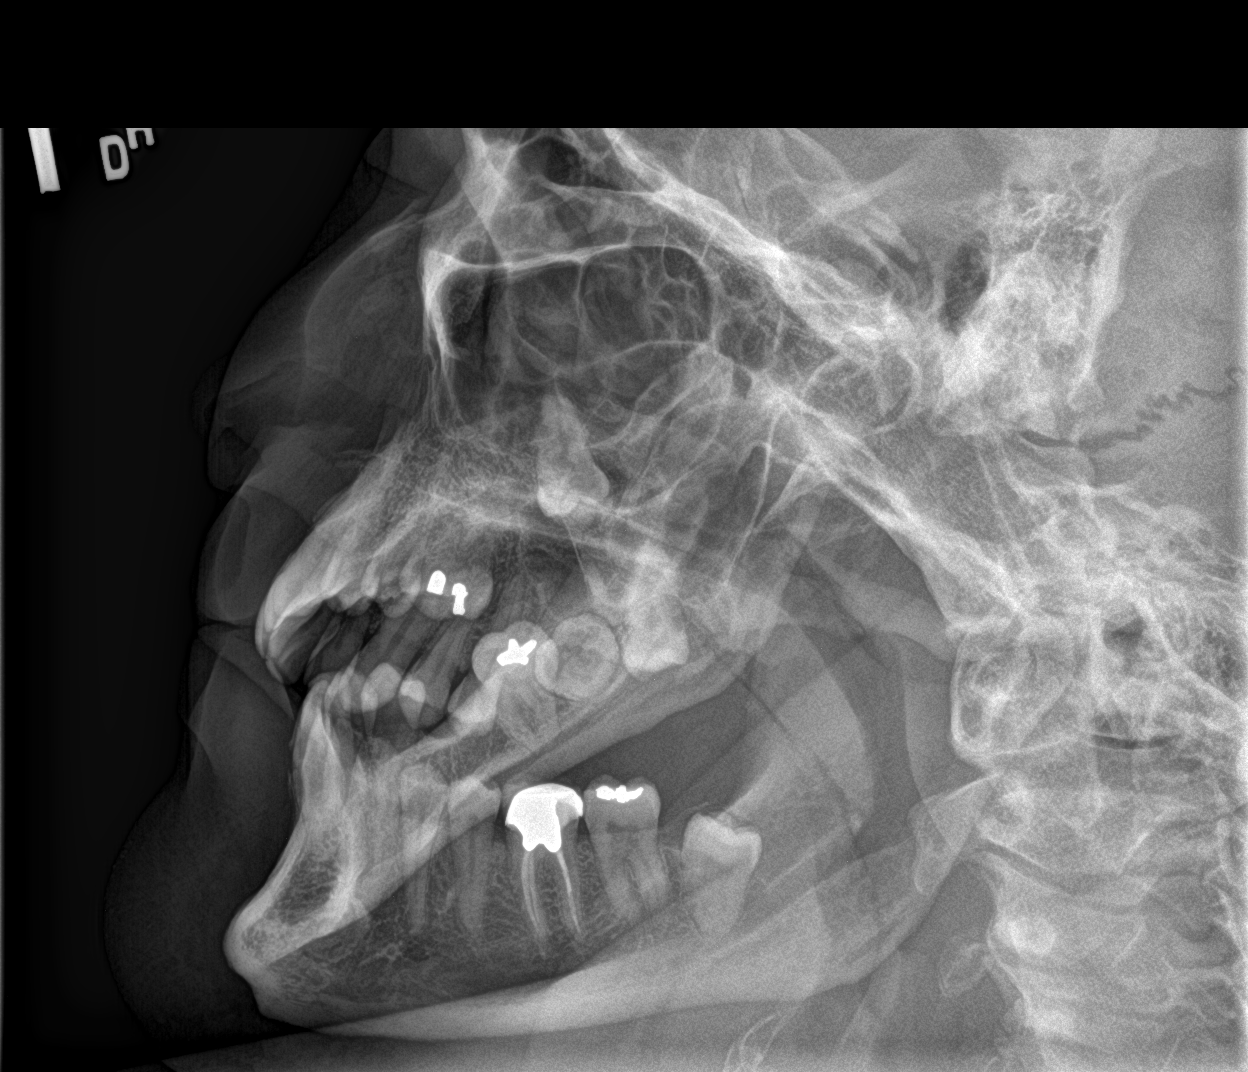

[mandible obl (2 of 2)]
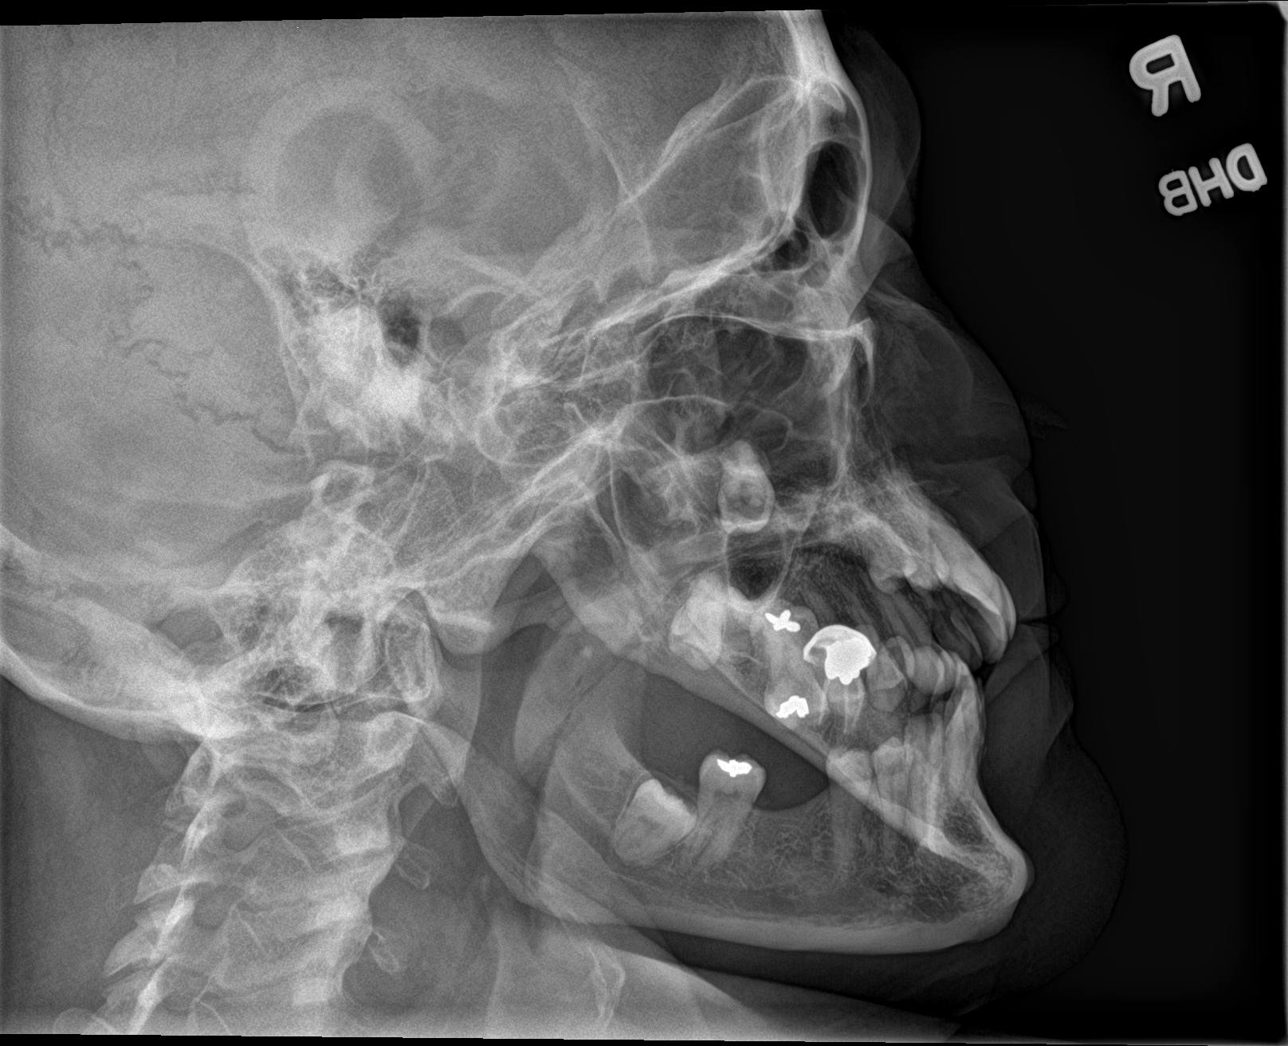

[mandible pa]
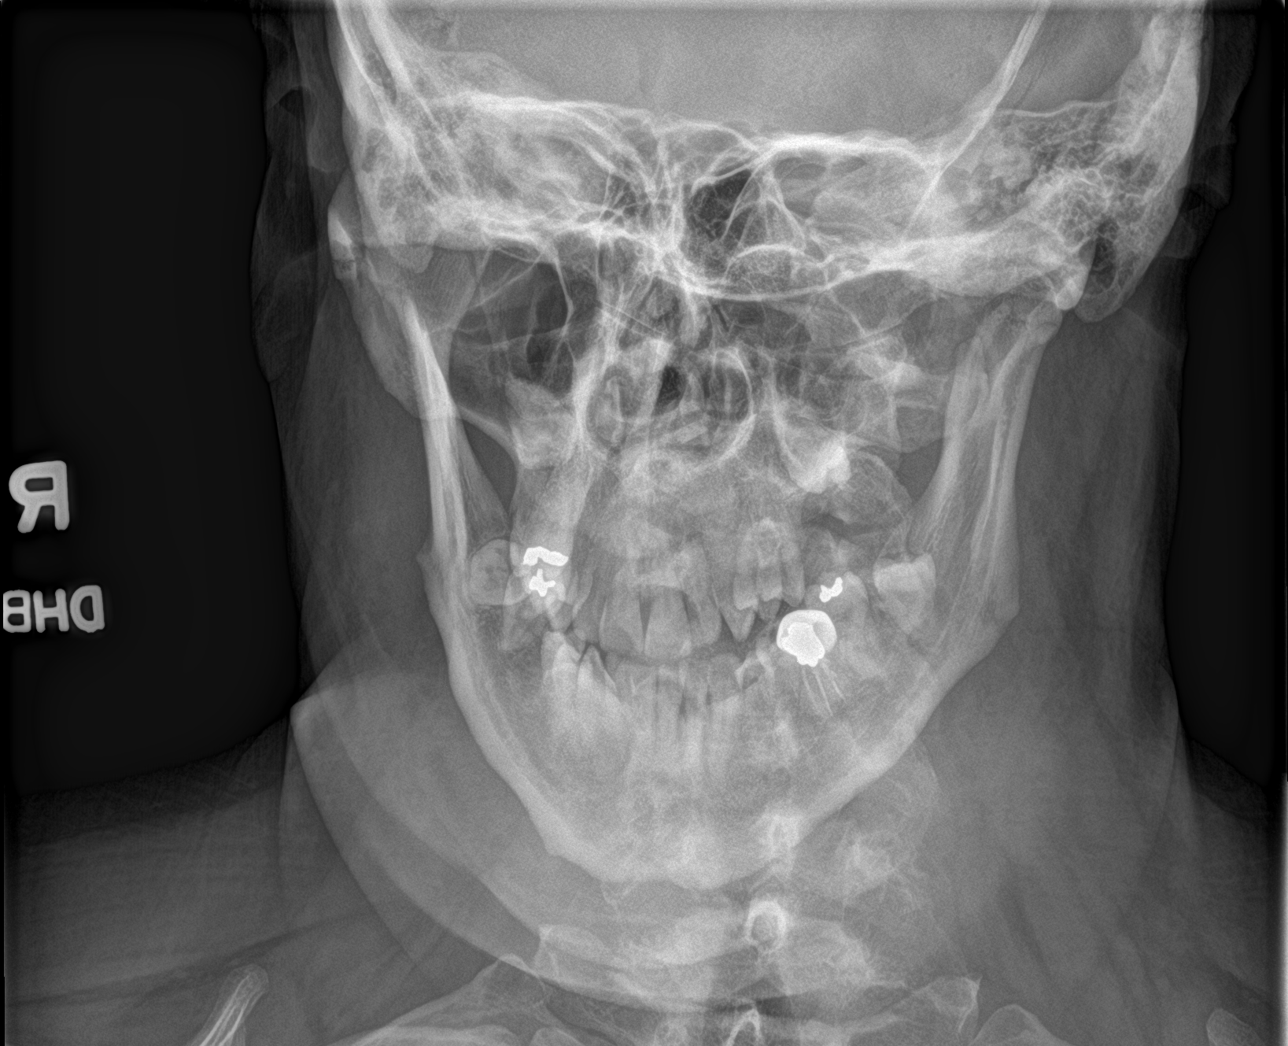

[4 of 4 positions shown; findings below may reference images not displayed]

FINDINGS: There is no evidence of fracture or other focal bone lesions.
IMPRESSION: Negative.  Please note, CT is reference standard.

## 2021-10-17 ENCOUNTER — Inpatient Hospital Stay
Admit: 2021-10-17 | Discharge: 2021-10-17 | Disposition: A | Discharge status: Home / Self Care | Payer: PRIVATE HEALTH INSURANCE

## 2021-10-17 DIAGNOSIS — M545 Low back pain, unspecified: Secondary | ICD-10-CM

## 2021-10-17 DIAGNOSIS — G8929 Other chronic pain: Secondary | ICD-10-CM

## 2021-10-17 MED ORDER — NAPROXEN 500 MG PO TABS
500 MG | ORAL_TABLET | Freq: Two times a day (BID) | ORAL | 0 refills | Status: AC
Start: 2021-10-17 — End: 2021-10-27

## 2021-10-17 MED ORDER — KETOROLAC TROMETHAMINE 30 MG/ML IJ SOLN
30 MG/ML | Freq: Once | INTRAMUSCULAR | Status: AC
Start: 2021-10-17 — End: 2021-10-17
  Administered 2021-10-17: 20:00:00 30 mg via INTRAMUSCULAR

## 2021-10-17 MED ORDER — LIDOCAINE 4 % EX PTCH
4 % | Freq: Once | CUTANEOUS | Status: DC
Start: 2021-10-17 — End: 2021-10-17
  Administered 2021-10-17: 20:00:00 1 via TRANSDERMAL

## 2021-10-17 MED ORDER — LIDOCAINE 5 % EX PTCH
5 % | MEDICATED_PATCH | Freq: Every day | CUTANEOUS | 0 refills | Status: AC
Start: 2021-10-17 — End: ?

## 2021-10-17 MED ORDER — CYCLOBENZAPRINE HCL 10 MG PO TABS
10 MG | ORAL_TABLET | Freq: Three times a day (TID) | ORAL | 0 refills | Status: AC | PRN
Start: 2021-10-17 — End: 2021-10-27

## 2021-10-17 MED FILL — KETOROLAC TROMETHAMINE 30 MG/ML IJ SOLN: 30 MG/ML | INTRAMUSCULAR | Qty: 1

## 2021-10-17 MED FILL — ASPERCREME LIDOCAINE 4 % EX PTCH: 4 % | CUTANEOUS | Qty: 1
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
